# Patient Record
Sex: Male | Born: 1970 | Race: White | Hispanic: No | Marital: Single | Smoking: Former smoker
Health system: Southern US, Community
[De-identification: ages and names within clinical notes are randomized; demographics above are authoritative.]

## PROBLEM LIST (undated history)

## (undated) DIAGNOSIS — G4730 Sleep apnea, unspecified: Secondary | ICD-10-CM

## (undated) DIAGNOSIS — S069XAA Unspecified intracranial injury with loss of consciousness status unknown, initial encounter: Secondary | ICD-10-CM

## (undated) DIAGNOSIS — M5416 Radiculopathy, lumbar region: Secondary | ICD-10-CM

## (undated) DIAGNOSIS — M722 Plantar fascial fibromatosis: Secondary | ICD-10-CM

## (undated) DIAGNOSIS — H8110 Benign paroxysmal vertigo, unspecified ear: Secondary | ICD-10-CM

## (undated) DIAGNOSIS — S069X9A Unspecified intracranial injury with loss of consciousness of unspecified duration, initial encounter: Secondary | ICD-10-CM

## (undated) HISTORY — DX: Radiculopathy, lumbar region: M54.16

## (undated) HISTORY — DX: Unspecified intracranial injury with loss of consciousness of unspecified duration, initial encounter: S06.9X9A

## (undated) HISTORY — DX: Plantar fascial fibromatosis: M72.2

## (undated) HISTORY — DX: Sleep apnea, unspecified: G47.30

## (undated) HISTORY — DX: Benign paroxysmal vertigo, unspecified ear: H81.10

## (undated) HISTORY — PX: TONSILLECTOMY: SUR1361

## (undated) HISTORY — DX: Unspecified intracranial injury with loss of consciousness status unknown, initial encounter: S06.9XAA

---

## 2001-08-25 ENCOUNTER — Ambulatory Visit (HOSPITAL_COMMUNITY): Admission: RE | Admit: 2001-08-25 | Discharge: 2001-08-25 | Payer: Self-pay | Admitting: Internal Medicine

## 2004-03-28 ENCOUNTER — Encounter: Admission: RE | Admit: 2004-03-28 | Discharge: 2004-03-28 | Payer: Self-pay | Admitting: Internal Medicine

## 2012-06-25 ENCOUNTER — Emergency Department (HOSPITAL_COMMUNITY)
Admission: EM | Admit: 2012-06-25 | Discharge: 2012-06-26 | Disposition: A | Discharge status: Home / Self Care | Payer: Self-pay | Attending: Emergency Medicine | Admitting: Emergency Medicine

## 2012-06-25 ENCOUNTER — Encounter (HOSPITAL_COMMUNITY): Payer: Self-pay | Admitting: Emergency Medicine

## 2012-06-25 ENCOUNTER — Emergency Department (HOSPITAL_COMMUNITY): Payer: Self-pay

## 2012-06-25 DIAGNOSIS — Y92410 Unspecified street and highway as the place of occurrence of the external cause: Secondary | ICD-10-CM | POA: Insufficient documentation

## 2012-06-25 DIAGNOSIS — S060X1A Concussion with loss of consciousness of 30 minutes or less, initial encounter: Secondary | ICD-10-CM | POA: Insufficient documentation

## 2012-06-25 DIAGNOSIS — Y9389 Activity, other specified: Secondary | ICD-10-CM | POA: Insufficient documentation

## 2012-06-25 DIAGNOSIS — Z79899 Other long term (current) drug therapy: Secondary | ICD-10-CM | POA: Insufficient documentation

## 2012-06-25 DIAGNOSIS — IMO0002 Reserved for concepts with insufficient information to code with codable children: Secondary | ICD-10-CM | POA: Insufficient documentation

## 2012-06-25 DIAGNOSIS — M545 Low back pain: Secondary | ICD-10-CM

## 2012-06-25 DIAGNOSIS — S8990XA Unspecified injury of unspecified lower leg, initial encounter: Secondary | ICD-10-CM | POA: Insufficient documentation

## 2012-06-25 MED ORDER — OXYCODONE-ACETAMINOPHEN 5-325 MG PO TABS
2.0000 | ORAL_TABLET | Freq: Once | ORAL | Status: AC
  Administered 2012-06-26: 1 via ORAL

## 2012-06-25 MED ORDER — DIAZEPAM 5 MG PO TABS
10.0000 mg | ORAL_TABLET | Freq: Once | ORAL | Status: AC
  Administered 2012-06-26: 10 mg via ORAL

## 2012-06-25 MED ORDER — OXYCODONE-ACETAMINOPHEN 5-325 MG PO TABS
ORAL_TABLET | ORAL | Status: AC
  Filled 2012-06-25: qty 1

## 2012-06-25 MED ORDER — DIAZEPAM 5 MG PO TABS
ORAL_TABLET | ORAL | Status: AC
  Administered 2012-06-26: 10 mg via ORAL
  Filled 2012-06-25: qty 2
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
## 2012-06-26 ENCOUNTER — Emergency Department (HOSPITAL_COMMUNITY): Payer: Self-pay

## 2012-06-26 MED ORDER — HYDROCODONE-ACETAMINOPHEN 5-325 MG PO TABS: 2.0000 | ORAL_TABLET | ORAL | Status: DC | PRN

## 2012-06-26 MED ORDER — NAPROXEN 500 MG PO TABS: 500.0000 mg | ORAL_TABLET | Freq: Two times a day (BID) | ORAL | Status: DC

## 2012-06-26 MED ORDER — METHOCARBAMOL 500 MG PO TABS: 500.0000 mg | ORAL_TABLET | Freq: Two times a day (BID) | ORAL | Status: DC

## 2012-06-26 NOTE — ED Notes (Signed)
Pt denies any further questions upon discharge.

## 2012-06-27 NOTE — ED Provider Notes (Signed)
Medical screening examination/treatment/procedure(s) were performed by non-physician practitioner and as supervising physician I was immediately available for consultation/collaboration.    [name], MD 06/27/12 [phone]

## 2012-07-01 ENCOUNTER — Emergency Department (HOSPITAL_COMMUNITY)
Admission: EM | Admit: 2012-07-01 | Discharge: 2012-07-01 | Disposition: A | Discharge status: Home / Self Care | Payer: Self-pay | Attending: Emergency Medicine | Admitting: Emergency Medicine

## 2012-07-01 ENCOUNTER — Encounter (HOSPITAL_COMMUNITY): Payer: Self-pay | Admitting: *Deleted

## 2012-07-01 DIAGNOSIS — Y92410 Unspecified street and highway as the place of occurrence of the external cause: Secondary | ICD-10-CM | POA: Insufficient documentation

## 2012-07-01 DIAGNOSIS — M79605 Pain in left leg: Secondary | ICD-10-CM

## 2012-07-01 DIAGNOSIS — Y939 Activity, unspecified: Secondary | ICD-10-CM | POA: Insufficient documentation

## 2012-07-01 DIAGNOSIS — S8990XA Unspecified injury of unspecified lower leg, initial encounter: Secondary | ICD-10-CM | POA: Insufficient documentation

## 2012-07-01 DIAGNOSIS — S99929A Unspecified injury of unspecified foot, initial encounter: Secondary | ICD-10-CM | POA: Insufficient documentation

## 2012-07-01 MED ORDER — METHOCARBAMOL 500 MG PO TABS: 500.0000 mg | ORAL_TABLET | Freq: Two times a day (BID) | ORAL | Status: AC

## 2012-07-01 MED ORDER — HYDROCODONE-ACETAMINOPHEN 5-325 MG PO TABS: 2.0000 | ORAL_TABLET | Freq: Four times a day (QID) | ORAL | Status: DC | PRN
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
## 2012-07-04 NOTE — ED Provider Notes (Signed)
Medical screening examination/treatment/procedure(s) were performed by non-physician practitioner and as supervising physician I was immediately available for consultation/collaboration.  Derris Millan L Taven Strite, MD 07/04/12 0742 

## 2012-07-13 ENCOUNTER — Ambulatory Visit (INDEPENDENT_AMBULATORY_CARE_PROVIDER_SITE_OTHER): Payer: Self-pay | Admitting: Neurology

## 2012-07-13 ENCOUNTER — Encounter: Payer: Self-pay | Admitting: Neurology

## 2012-07-13 VITALS — BP 100/70 | HR 68 | Temp 97.6°F | Resp 12 | Ht 72.0 in | Wt 238.0 lb

## 2012-07-13 DIAGNOSIS — H8110 Benign paroxysmal vertigo, unspecified ear: Secondary | ICD-10-CM
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
## 2012-07-13 NOTE — Patient Instructions (Addendum)
Call us in 2 weeks and let us know how you are doing.

## 2012-07-26 ENCOUNTER — Telehealth: Payer: Self-pay | Admitting: Neurology

## 2012-07-26 NOTE — Telephone Encounter (Signed)
Message copied by Benay Spice on Tue Jul 26, 2012  2:18 PM ------      Message from: Benay Spice      Created: Wed Jul 13, 2012  2:46 PM      Regarding: call to see how he is feeling       Call around April 16th to see how he is doing. ------
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
## 2016-06-29 ENCOUNTER — Encounter (INDEPENDENT_AMBULATORY_CARE_PROVIDER_SITE_OTHER): Payer: Self-pay

## 2016-06-29 ENCOUNTER — Encounter: Payer: Self-pay | Admitting: Neurology

## 2016-06-29 ENCOUNTER — Ambulatory Visit (INDEPENDENT_AMBULATORY_CARE_PROVIDER_SITE_OTHER): Payer: Managed Care, Other (non HMO) | Admitting: Neurology

## 2016-06-29 VITALS — BP 127/72 | HR 85 | Resp 20 | Ht 73.0 in | Wt 256.0 lb

## 2016-06-29 DIAGNOSIS — G4719 Other hypersomnia: Secondary | ICD-10-CM | POA: Diagnosis not present

## 2016-06-29 DIAGNOSIS — R0683 Snoring: Secondary | ICD-10-CM

## 2016-06-29 DIAGNOSIS — E669 Obesity, unspecified: Secondary | ICD-10-CM

## 2016-06-29 DIAGNOSIS — G4710 Hypersomnia, unspecified: Secondary | ICD-10-CM

## 2016-06-29 DIAGNOSIS — G4730 Sleep apnea, unspecified: Secondary | ICD-10-CM

## 2016-06-29 DIAGNOSIS — E66811 Obesity, class 1: Secondary | ICD-10-CM

## 2016-06-29 MED ORDER — MOMETASONE FUROATE 50 MCG/ACT NA SUSP: 2.0000 | Freq: Every day | NASAL | 12 refills | Status: AC

## 2016-06-29 NOTE — Addendum Note (Signed)
Addended by: Melvyn NovasHMEIER, Sherlene Rickel on: 06/29/2016 04:09 PM   Modules accepted: Orders

## 2016-06-29 NOTE — Patient Instructions (Signed)
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
## 2016-08-04 ENCOUNTER — Encounter: Payer: Self-pay | Admitting: Neurology

## 2016-09-15 ENCOUNTER — Ambulatory Visit (INDEPENDENT_AMBULATORY_CARE_PROVIDER_SITE_OTHER): Payer: Managed Care, Other (non HMO)

## 2016-09-15 ENCOUNTER — Encounter (INDEPENDENT_AMBULATORY_CARE_PROVIDER_SITE_OTHER): Payer: Self-pay | Admitting: Orthopaedic Surgery

## 2016-09-15 ENCOUNTER — Ambulatory Visit (INDEPENDENT_AMBULATORY_CARE_PROVIDER_SITE_OTHER): Payer: Managed Care, Other (non HMO) | Admitting: Orthopaedic Surgery

## 2016-09-15 DIAGNOSIS — M25571 Pain in right ankle and joints of right foot: Secondary | ICD-10-CM

## 2016-09-15 DIAGNOSIS — M79641 Pain in right hand: Secondary | ICD-10-CM | POA: Diagnosis not present
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
## 2016-09-16 ENCOUNTER — Telehealth (INDEPENDENT_AMBULATORY_CARE_PROVIDER_SITE_OTHER): Payer: Self-pay | Admitting: Radiology

## 2016-09-16 ENCOUNTER — Other Ambulatory Visit (INDEPENDENT_AMBULATORY_CARE_PROVIDER_SITE_OTHER): Payer: Self-pay | Admitting: Family

## 2016-09-16 MED ORDER — HYDROCODONE-ACETAMINOPHEN 5-325 MG PO TABS: 1.0000 | ORAL_TABLET | Freq: Three times a day (TID) | ORAL | 0 refills | Status: AC | PRN

## 2016-09-16 NOTE — Telephone Encounter (Signed)
Patient calling this morning. He saw Dr. Roda ShuttersXu and had cast removed yesterday and he is now buddy taping his fingers. He has increased pain. He is requesting a prescription for something for pain. He is using [facility] [address] in [place]. His call back [phone]. Dr .Roda ShuttersXu is out of the office can you please advise.

## 2016-09-16 NOTE — Telephone Encounter (Signed)
I called and spoke with patient to advise rx for hydrocodone at front desk. Patient ok to pick up rx.

## 2016-10-06 ENCOUNTER — Encounter (INDEPENDENT_AMBULATORY_CARE_PROVIDER_SITE_OTHER): Payer: Self-pay | Admitting: Orthopaedic Surgery

## 2016-10-06 ENCOUNTER — Ambulatory Visit (INDEPENDENT_AMBULATORY_CARE_PROVIDER_SITE_OTHER): Payer: Managed Care, Other (non HMO)

## 2016-10-06 ENCOUNTER — Ambulatory Visit (INDEPENDENT_AMBULATORY_CARE_PROVIDER_SITE_OTHER): Payer: Self-pay | Admitting: Orthopaedic Surgery

## 2016-10-06 DIAGNOSIS — S62646A Nondisplaced fracture of proximal phalanx of right little finger, initial encounter for closed fracture: Secondary | ICD-10-CM | POA: Insufficient documentation

## 2016-10-06 DIAGNOSIS — S62646D Nondisplaced fracture of proximal phalanx of right little finger, subsequent encounter for fracture with routine healing: Secondary | ICD-10-CM
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
## 2016-10-27 ENCOUNTER — Ambulatory Visit (INDEPENDENT_AMBULATORY_CARE_PROVIDER_SITE_OTHER): Payer: Managed Care, Other (non HMO) | Admitting: Orthopaedic Surgery

## 2021-04-22 ENCOUNTER — Encounter: Payer: Self-pay | Admitting: Physician Assistant

## 2022-10-13 ENCOUNTER — Other Ambulatory Visit (HOSPITAL_COMMUNITY): Payer: Self-pay | Admitting: Physician Assistant

## 2022-10-13 DIAGNOSIS — Z8249 Family history of ischemic heart disease and other diseases of the circulatory system: Secondary | ICD-10-CM

## 2022-10-13 DIAGNOSIS — G4733 Obstructive sleep apnea (adult) (pediatric): Secondary | ICD-10-CM

## 2022-10-29 ENCOUNTER — Telehealth (HOSPITAL_COMMUNITY): Payer: Self-pay

## 2022-10-29 NOTE — Telephone Encounter (Signed)
The patient was contacted and he stated that he needs to reschedule in November. He stated that he would call back closer to then. S.Zanai Mallari EMTP/CCT

## 2022-10-29 NOTE — Telephone Encounter (Signed)
Unable to make contact with the staff members for [name] at [facility] in [place]. Will try again later.

## 2022-11-02 ENCOUNTER — Telehealth (HOSPITAL_COMMUNITY): Payer: Self-pay | Admitting: Physician Assistant

## 2022-11-02 NOTE — Telephone Encounter (Signed)
Patient cancelled GXT ordered by Noelle Redmon and will call us back to reschedule later in the year per Milana Na. Order will be removed from WQ.

## 2022-11-03 ENCOUNTER — Ambulatory Visit (HOSPITAL_COMMUNITY): Payer: Self-pay
